# Patient Record
Sex: Female | Born: 2000 | Race: Black or African American | Hispanic: No | Marital: Single | Smoking: Never smoker
Health system: Southern US, Community
[De-identification: ages and names within clinical notes are randomized; demographics above are authoritative.]

## PROBLEM LIST (undated history)

## (undated) HISTORY — PX: NO PAST SURGERIES: SHX2092

---
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
## 2022-04-08 ENCOUNTER — Ambulatory Visit: Payer: Self-pay

## 2022-07-21 IMAGING — US US OB < 14 WEEKS - US OB TV
1 series · 14 of 28 positions shown · non-contrast
Comparison: None Available.

CLINICAL DATA: Pelvic pain

EXAM:
OBSTETRIC <14 WK ULTRASOUND
TECHNIQUE: Transabdominal ultrasound was performed for evaluation of the
gestation as well as the maternal uterus and adnexal regions.

[Series 1: us ob less than 14 weeks with ob transvaginal · 14 of 64 slices shown]
[im 3/64]
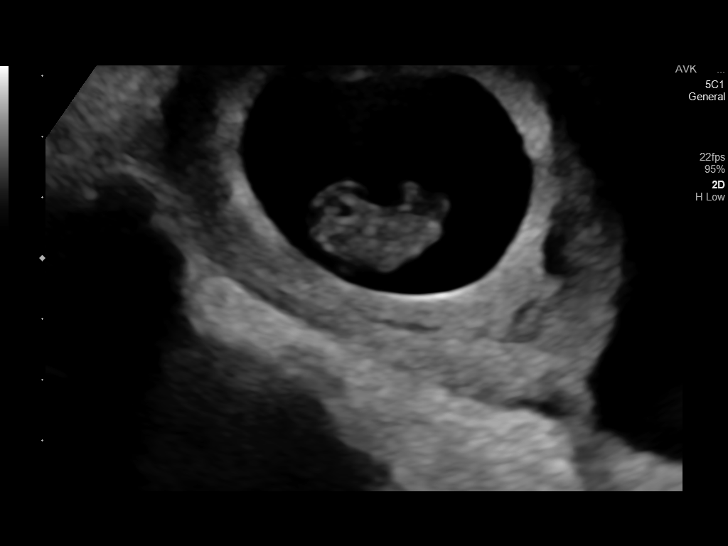
[im 8/64]
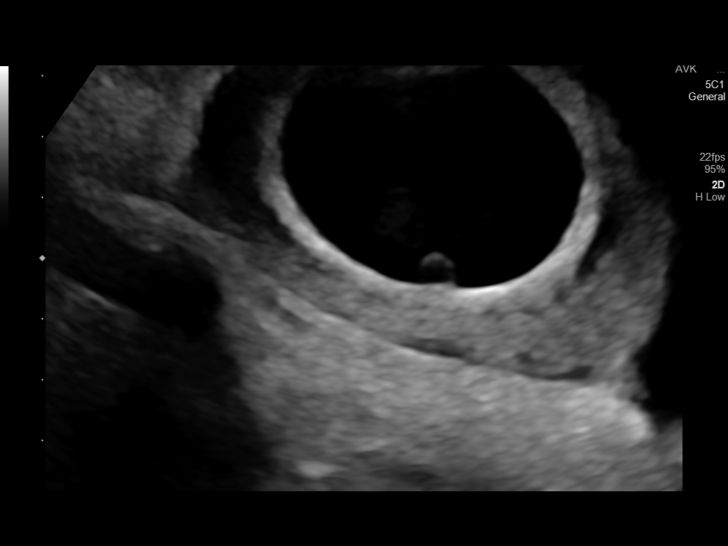
[im 12/64]
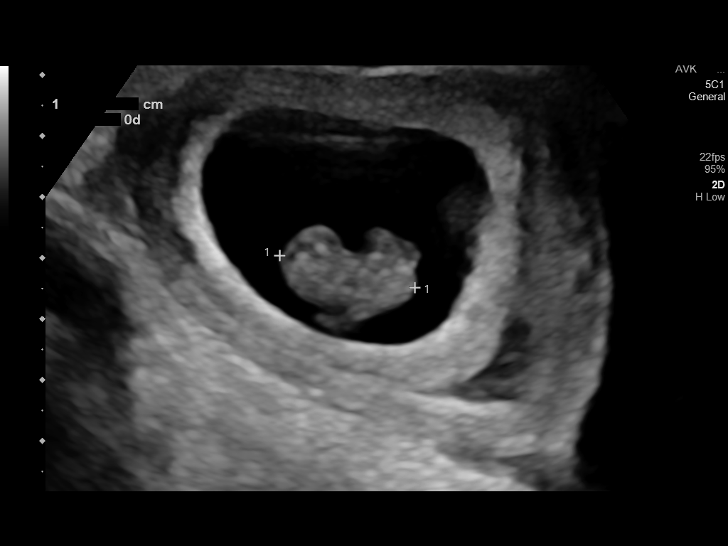
[im 17/64]
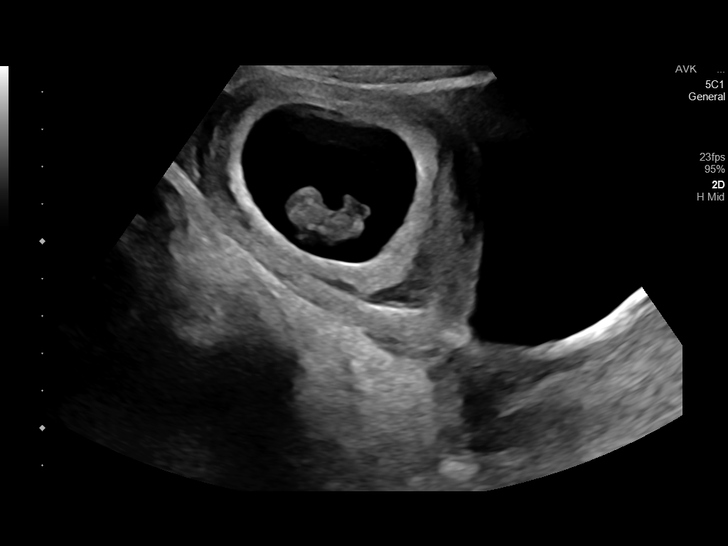
[im 22/64]
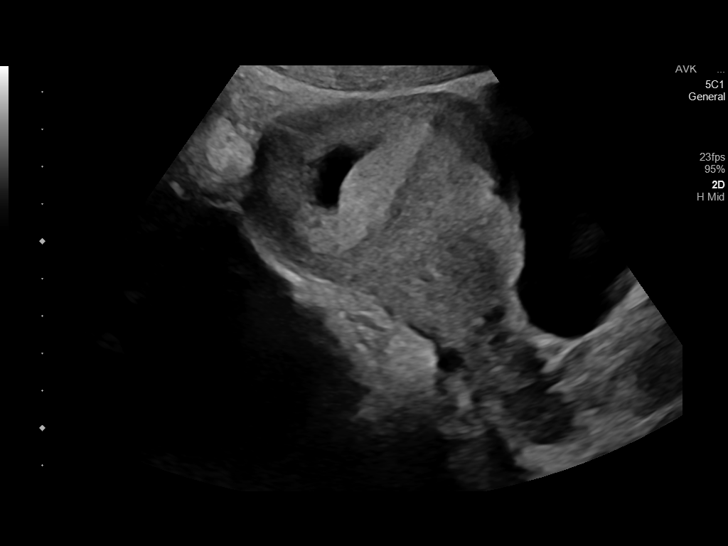
[im 26/64]
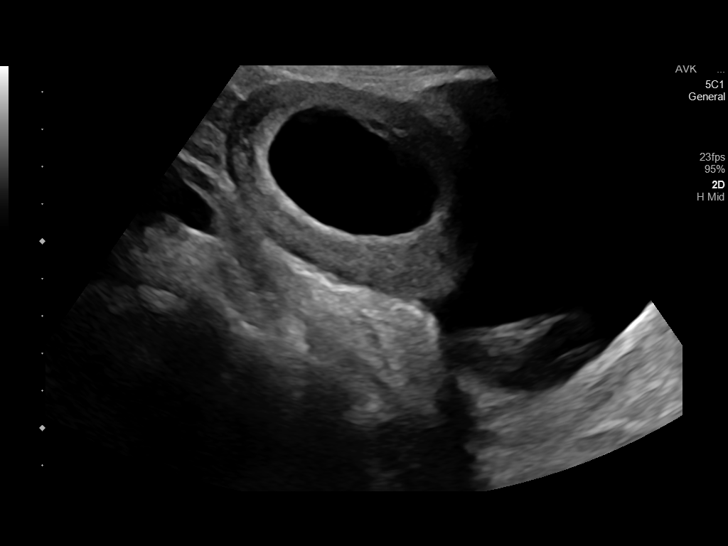
[im 31/64]
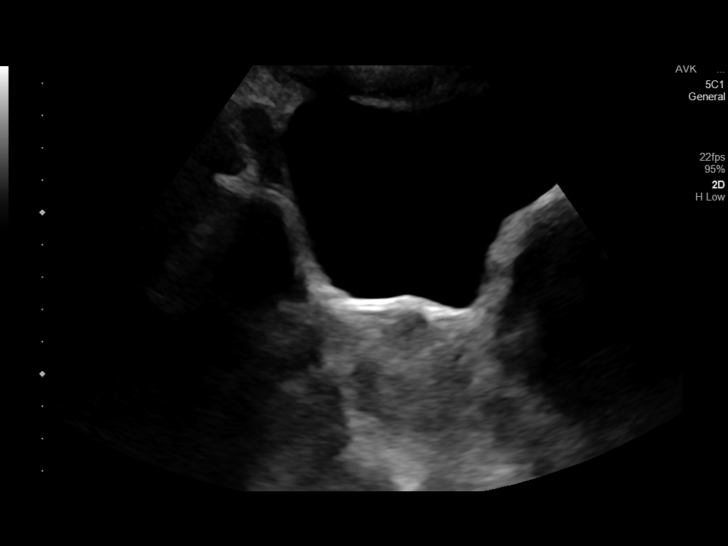
[im 36/64]
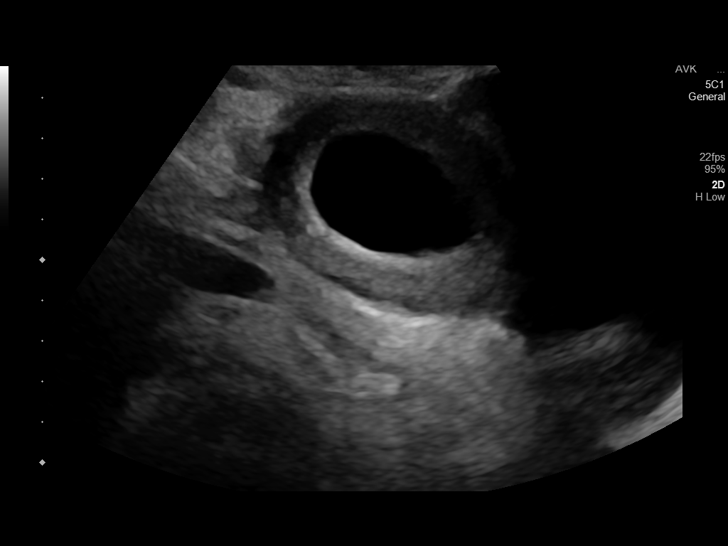
[im 40/64]
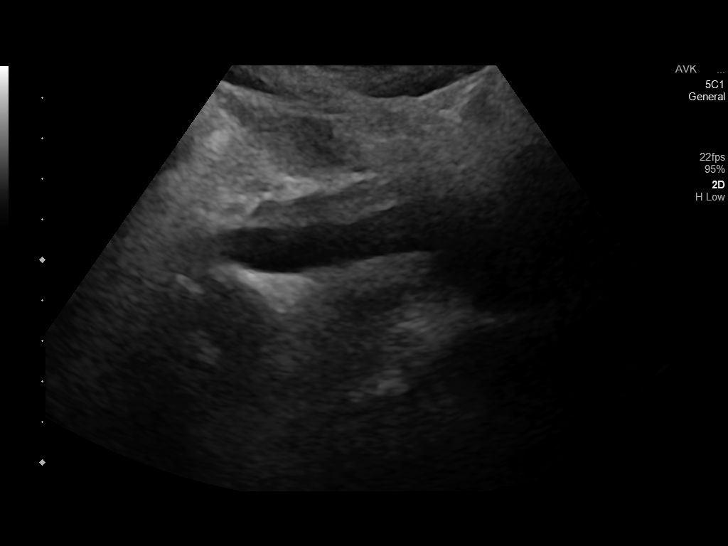
[im 45/64]
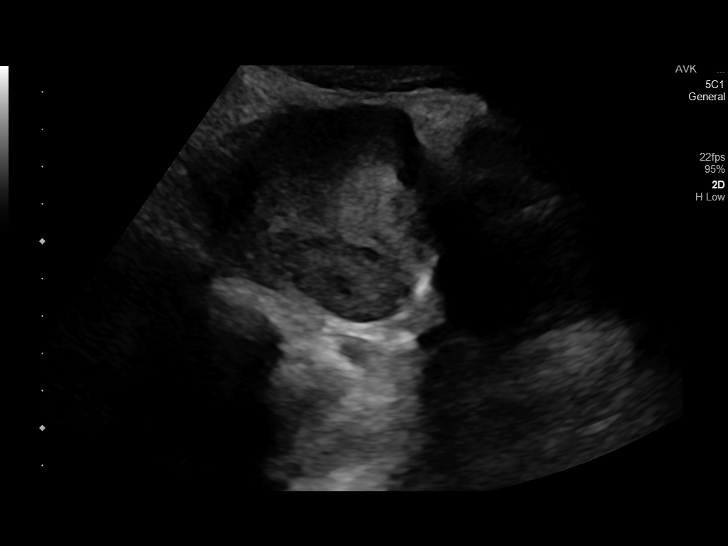
[im 50/64]
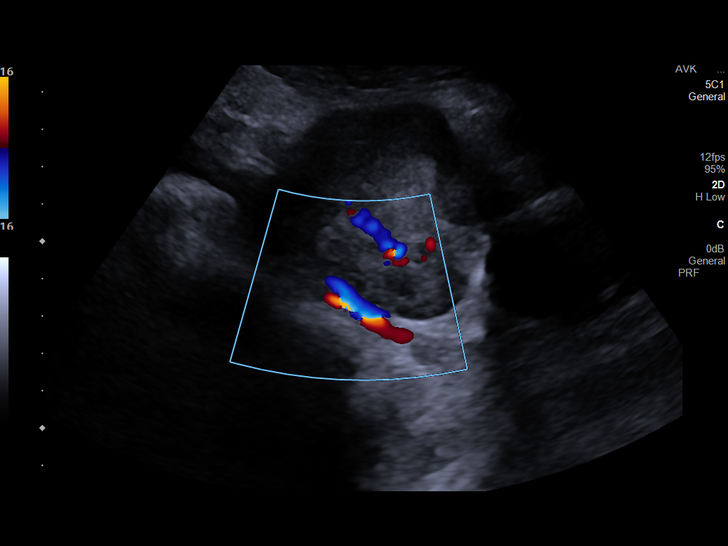
[im 54/64]
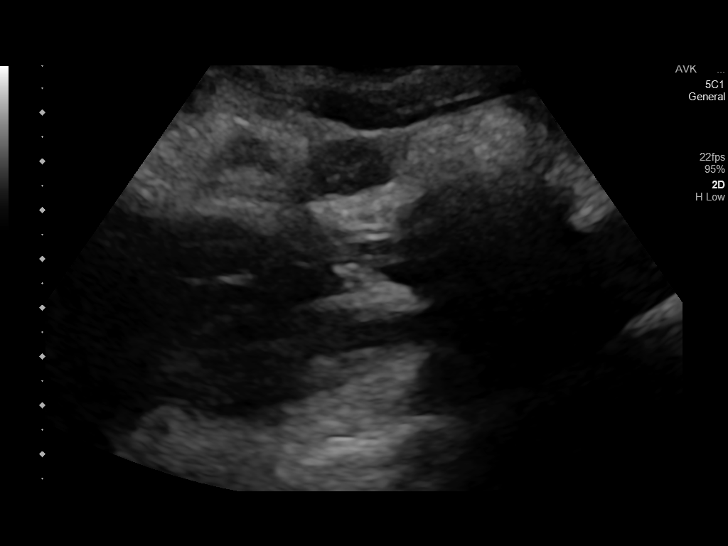
[im 59/64]
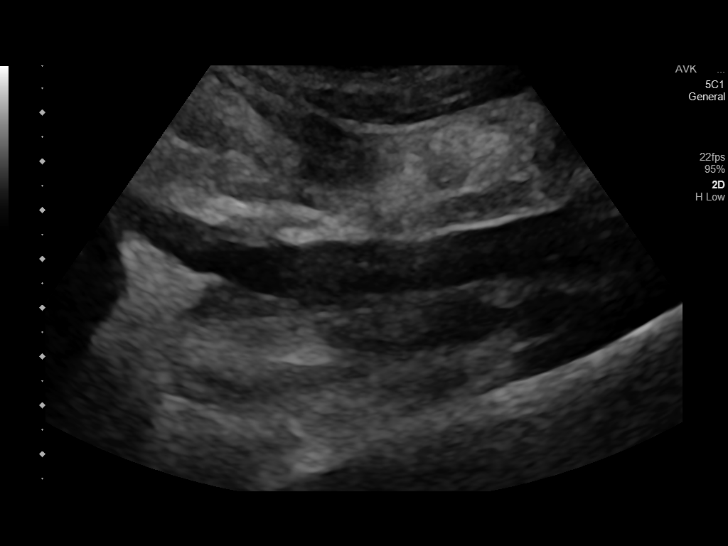
[im 64/64]
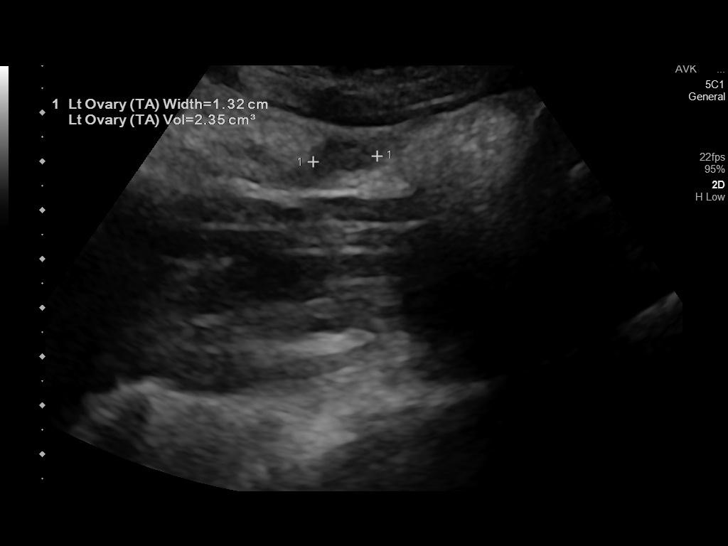

[14 of 28 positions shown; findings below may reference images not displayed]

FINDINGS: Intrauterine gestational sac: Single

Yolk sac:  Visualized.

Embryo:  Visualized.

Cardiac Activity: Visualized.

Heart Rate: 178 bpm

CRL:   22.7 mm   9 w 0 d                  US EDC: 06/02/2022

Subchorionic hemorrhage:  None visualized.

Maternal uterus/adnexae: Bilateral adnexal regions are unremarkable.
No free fluid within the pelvis.
IMPRESSION: 1. Single live intrauterine gestation measuring 9 weeks 0 days by
crown-rump length.
2. Active heart tones at 178 BPM.
